# Patient Record
Sex: Female | Born: 1980 | Race: White | Hispanic: No | Smoking: Never smoker
Health system: Southern US, Community
[De-identification: ages and names within clinical notes are randomized; demographics above are authoritative.]

## PROBLEM LIST (undated history)

## (undated) DIAGNOSIS — O139 Gestational [pregnancy-induced] hypertension without significant proteinuria, unspecified trimester: Secondary | ICD-10-CM

## (undated) DIAGNOSIS — O24419 Gestational diabetes mellitus in pregnancy, unspecified control: Secondary | ICD-10-CM

## (undated) HISTORY — DX: Gestational (pregnancy-induced) hypertension without significant proteinuria, unspecified trimester: O13.9

## (undated) HISTORY — PX: TUBAL LIGATION: SHX77

## (undated) HISTORY — DX: Gestational diabetes mellitus in pregnancy, unspecified control: O24.419

---

## 2000-06-27 HISTORY — PX: WISDOM TOOTH EXTRACTION: SHX21

## 2020-01-29 ENCOUNTER — Telehealth: Payer: Self-pay

## 2020-01-29 NOTE — Telephone Encounter (Signed)
Tried calling pt to pre-chart for her new patient appt Monday and she did not answer. Told her to call back and we can do pre chart when she does.  CM

## 2020-01-30 ENCOUNTER — Telehealth: Payer: Self-pay

## 2020-01-30 NOTE — Telephone Encounter (Signed)
Called pt to pre chart

## 2020-02-03 ENCOUNTER — Encounter: Payer: Self-pay | Admitting: Internal Medicine

## 2020-02-03 ENCOUNTER — Ambulatory Visit (INDEPENDENT_AMBULATORY_CARE_PROVIDER_SITE_OTHER): Payer: Self-pay | Admitting: Internal Medicine

## 2020-02-03 ENCOUNTER — Other Ambulatory Visit: Payer: Self-pay

## 2020-02-03 VITALS — BP 120/76 | HR 89 | Temp 98.5°F | Ht 66.0 in | Wt 160.0 lb

## 2020-02-03 DIAGNOSIS — Z7689 Persons encountering health services in other specified circumstances: Secondary | ICD-10-CM

## 2020-02-03 DIAGNOSIS — Z1231 Encounter for screening mammogram for malignant neoplasm of breast: Secondary | ICD-10-CM

## 2020-02-03 NOTE — Progress Notes (Signed)
Date:  02/03/2020   Name:  Lauren Gilmore   DOB:  08-15-1980   MRN:  [id number]   Chief Complaint: Establish Care  Pap ~2016 normal Mammogram - none (maternal grandmother had breast cancer)  Depression        This is a recurrent problem.  The problem occurs intermittently.  The problem has been waxing and waning since onset.  Associated symptoms include headaches (muscle tension headaches).  Associated symptoms include no fatigue, no appetite change and no suicidal ideas.     The symptoms are aggravated by family issues. She has just started sharing custody of her 82 y/o daughter with the father who lives 1.5 hours away - they alternate weeks and she is having trouble adjusting.  She keeps busy with her 34 y/o son and significant other in the home.  No results found for: CREATININE, BUN, NA, K, CL, CO2 No results found for: CHOL, HDL, LDLCALC, LDLDIRECT, TRIG, CHOLHDL No results found for: TSH No results found for: HGBA1C No results found for: WBC, HGB, HCT, MCV, PLT No results found for: ALT, AST, GGT, ALKPHOS, BILITOT   Review of Systems  Constitutional: Negative for appetite change, chills, fatigue and unexpected weight change.  Respiratory: Negative for chest tightness and shortness of breath.   Cardiovascular: Negative for chest pain, palpitations and leg swelling.  Genitourinary: Negative for menstrual problem.  Neurological: Positive for headaches (muscle tension headaches). Negative for dizziness.  Psychiatric/Behavioral: Positive for depression and dysphoric mood. Negative for sleep disturbance and suicidal ideas. The patient is nervous/anxious.     There are no problems to display for this patient.   Allergies  Allergen Reactions   Amoxicillin Hives and Rash    Past Surgical History:  Procedure Laterality Date   CESAREAN SECTION  2017   TUBAL LIGATION     WISDOM TOOTH EXTRACTION  2002    Social History   Tobacco Use   Smoking status: Never Smoker    Smokeless tobacco: Never Used  Vaping Use   Vaping Use: Never used  Substance Use Topics   Alcohol use: Yes    Comment: 1-2 week   Drug use: Not Currently     Medication list has been reviewed and updated.  No outpatient medications have been marked as taking for the 02/03/20 encounter (Office Visit) with Reubin Milan, MD.    [facility] 2/9 Scores 02/03/2020  PHQ - 2 Score 1  PHQ- 9 Score 9    GAD 7 : Generalized Anxiety Score 02/03/2020  Nervous, Anxious, on Edge 3  Control/stop worrying 3  Worry too much - different things 3  Trouble relaxing 2  Restless 2  Easily annoyed or irritable 3  Afraid - awful might happen 2  Total GAD 7 Score 18  Anxiety Difficulty Somewhat difficult    BP Readings from Last 3 Encounters:  02/03/20 120/76    Physical Exam Vitals and nursing note reviewed.  Constitutional:      General: She is not in acute distress.    Appearance: Normal appearance. She is well-developed.  HENT:     Head: Normocephalic and atraumatic.  Cardiovascular:     Rate and Rhythm: Normal rate and regular rhythm.     Pulses: Normal pulses.     Heart sounds: No murmur heard.   Pulmonary:     Effort: Pulmonary effort is normal. No respiratory distress.     Breath sounds: No wheezing or rhonchi.  Musculoskeletal:  General: Normal range of motion.     Cervical back: Normal range of motion.  Lymphadenopathy:     Cervical: No cervical adenopathy.  Skin:    General: Skin is warm and dry.     Findings: Rash (poison ivy on right hand) present.  Neurological:     General: No focal deficit present.     Mental Status: She is alert and oriented to person, place, and time.     Motor: Motor function is intact.     Coordination: Coordination is intact.     Deep Tendon Reflexes:     Reflex Scores:      Bicep reflexes are 1+ on the right side and 1+ on the left side.      Patellar reflexes are 1+ on the right side and 1+ on the left side. Psychiatric:         Attention and Perception: Attention normal.        Mood and Affect: Mood normal.        Speech: Speech normal.        Behavior: Behavior normal.        Thought Content: Thought content does not include suicidal ideation. Thought content does not include suicidal plan.     Wt Readings from Last 3 Encounters:  02/03/20 160 lb (72.6 kg)    BP 120/76    Pulse 89    Temp 98.5 F (36.9 C) (Oral)    Ht 5\' 6"  (1.676 m)    Wt 160 lb (72.6 kg)    LMP 01/09/2020 (Approximate)    SpO2 96%    BMI 25.82 kg/m   Assessment and Plan: 1. Encounter to establish care Pt will return for CPX and Pap Mood disorder is moderately severe but she is coping well and does not require medication or counseling at this time  2. Encounter for screening mammogram for breast cancer Call for mammogram for the uninsured - MM 3D SCREEN BREAST BILATERAL; Future   Partially dictated using 01/11/2020. Any errors are unintentional.  Animal nutritionist, MD [facility]  02/03/2020

## 2020-02-27 ENCOUNTER — Other Ambulatory Visit: Payer: Self-pay

## 2020-02-27 ENCOUNTER — Ambulatory Visit
Admission: RE | Admit: 2020-02-27 | Discharge: 2020-02-27 | Disposition: A | Discharge status: Home / Self Care | Payer: 59 | Source: Ambulatory Visit | Attending: Internal Medicine | Admitting: Internal Medicine

## 2020-02-27 DIAGNOSIS — Z1231 Encounter for screening mammogram for malignant neoplasm of breast: Secondary | ICD-10-CM | POA: Diagnosis present

## 2020-03-24 ENCOUNTER — Other Ambulatory Visit (HOSPITAL_COMMUNITY)
Admission: RE | Admit: 2020-03-24 | Discharge: 2020-03-24 | Disposition: A | Discharge status: Home / Self Care | Payer: 59 | Source: Ambulatory Visit | Attending: Internal Medicine | Admitting: Internal Medicine

## 2020-03-24 ENCOUNTER — Ambulatory Visit (INDEPENDENT_AMBULATORY_CARE_PROVIDER_SITE_OTHER): Payer: 59 | Admitting: Internal Medicine

## 2020-03-24 ENCOUNTER — Encounter: Payer: Self-pay | Admitting: Internal Medicine

## 2020-03-24 ENCOUNTER — Other Ambulatory Visit: Payer: Self-pay

## 2020-03-24 VITALS — BP 124/88 | HR 79 | Temp 98.3°F | Ht 66.0 in | Wt 159.0 lb

## 2020-03-24 DIAGNOSIS — Z124 Encounter for screening for malignant neoplasm of cervix: Secondary | ICD-10-CM | POA: Diagnosis present

## 2020-03-24 DIAGNOSIS — F39 Unspecified mood [affective] disorder: Secondary | ICD-10-CM | POA: Diagnosis not present

## 2020-03-24 DIAGNOSIS — Z0000 Encounter for general adult medical examination without abnormal findings: Secondary | ICD-10-CM | POA: Diagnosis not present

## 2020-03-24 DIAGNOSIS — Z23 Encounter for immunization: Secondary | ICD-10-CM

## 2020-03-24 LAB — POCT URINALYSIS DIPSTICK
Bilirubin, UA: NEGATIVE
Blood, UA: NEGATIVE
Glucose, UA: NEGATIVE
Ketones, UA: NEGATIVE
Leukocytes, UA: NEGATIVE
Nitrite, UA: NEGATIVE
Protein, UA: NEGATIVE
Spec Grav, UA: <=1.005 — AB (ref 1.010–1.025)
Urobilinogen, UA: 0.2 E.U./dL
pH, UA: 6 (ref 5.0–8.0)

## 2020-03-24 MED ORDER — ESCITALOPRAM OXALATE 10 MG PO TABS: 10.0000 mg | ORAL_TABLET | Freq: Every day | ORAL | 1 refills | Status: DC

## 2020-03-24 NOTE — Progress Notes (Signed)
Date:  03/24/2020   Name:  Lauren Gilmore   DOB:  December 17, 1980   MRN:  [id number]   Chief Complaint: Annual Exam (Breast Exam. Pap smear. Flu shot. ), Anxiety (19 GAD7), and Depression (18 PHQ9)  Lauren Gilmore is a 39 y.o. female who presents today for her Complete Annual Exam. She feels well. She reports exercising - walking 8 hours daily at work. She reports she is sleeping fairly well. Breast complaints - none.  Mammogram: 02/2020 Pap smear: due 2016 normal   Immunization History  Administered Date(s) Administered  . PFIZER SARS-COV-2 Vaccination 10/04/2019, 10/25/2019  . PPD Test 10/07/2019    Depression        This is a new problem.  The current episode started more than 1 month ago. The problem is unchanged.  Associated symptoms include insomnia, irritable (and anxious) and appetite change.  Associated symptoms include no fatigue, no headaches and no suicidal ideas.     The symptoms are aggravated by family issues.  Past treatments include nothing.   No results found for: CREATININE, BUN, NA, K, CL, CO2 No results found for: CHOL, HDL, LDLCALC, LDLDIRECT, TRIG, CHOLHDL No results found for: TSH No results found for: HGBA1C No results found for: WBC, HGB, HCT, MCV, PLT No results found for: ALT, AST, GGT, ALKPHOS, BILITOT   Review of Systems  Constitutional: Positive for appetite change. Negative for chills, fatigue and fever.  HENT: Negative for congestion, hearing loss, tinnitus, trouble swallowing and voice change.   Eyes: Negative for visual disturbance.  Respiratory: Negative for cough, chest tightness, shortness of breath and wheezing.   Cardiovascular: Positive for palpitations. Negative for chest pain and leg swelling.  Gastrointestinal: Negative for abdominal pain, constipation, diarrhea and vomiting.  Endocrine: Negative for polydipsia and polyuria.  Genitourinary: Negative for dysuria, frequency, genital sores, menstrual problem, vaginal bleeding  and vaginal discharge.  Musculoskeletal: Negative for arthralgias, gait problem and joint swelling.  Skin: Negative for color change and rash.  Neurological: Negative for dizziness, tremors, light-headedness and headaches.  Hematological: Negative for adenopathy. Does not bruise/bleed easily.  Psychiatric/Behavioral: Positive for depression and sleep disturbance (Difficulty falling asleep.). Negative for dysphoric mood and suicidal ideas. The patient is nervous/anxious (Going through a divorce and child custody battle.) and has insomnia.     There are no problems to display for this patient.   Allergies  Allergen Reactions  . Amoxicillin Hives and Rash    Past Surgical History:  Procedure Laterality Date  . CESAREAN SECTION  2017  . TUBAL LIGATION    . WISDOM TOOTH EXTRACTION  2002    Social History   Tobacco Use  . Smoking status: Never Smoker  . Smokeless tobacco: Never Used  Vaping Use  . Vaping Use: Never used  Substance Use Topics  . Alcohol use: Yes    Comment: 1-2 week  . Drug use: Not Currently     Medication list has been reviewed and updated.  No outpatient medications have been marked as taking for the 03/24/20 encounter (Office Visit) with Reubin Milan, MD.    [facility] 2/9 Scores 03/24/2020 02/03/2020  PHQ - 2 Score 4 1  PHQ- 9 Score 18 9    GAD 7 : Generalized Anxiety Score 03/24/2020 02/03/2020  Nervous, Anxious, on Edge 3 3  Control/stop worrying 3 3  Worry too much - different things 3 3  Trouble relaxing 3 2  Restless 3 2  Easily annoyed or irritable 3 3  Afraid -  awful might happen 1 2  Total GAD 7 Score 19 18  Anxiety Difficulty Extremely difficult Somewhat difficult    BP Readings from Last 3 Encounters:  03/24/20 124/88  02/03/20 120/76    Physical Exam Vitals and nursing note reviewed.  Constitutional:      General: She is irritable (and anxious). She is not in acute distress.    Appearance: She is well-developed.  HENT:     Head:  Normocephalic and atraumatic.     Right Ear: Tympanic membrane and ear canal normal.     Left Ear: Tympanic membrane and ear canal normal.     Nose:     Right Sinus: No maxillary sinus tenderness.     Left Sinus: No maxillary sinus tenderness.  Eyes:     General: No scleral icterus.       Right eye: No discharge.        Left eye: No discharge.     Conjunctiva/sclera: Conjunctivae normal.  Neck:     Thyroid: No thyromegaly.     Vascular: No carotid bruit.  Cardiovascular:     Rate and Rhythm: Normal rate and regular rhythm.     Pulses: Normal pulses.     Heart sounds: Normal heart sounds.  Pulmonary:     Effort: Pulmonary effort is normal. No respiratory distress.     Breath sounds: No wheezing.  Chest:     Breasts:        Right: No mass, nipple discharge, skin change or tenderness.        Left: No mass, nipple discharge, skin change or tenderness.  Abdominal:     General: Bowel sounds are normal.     Palpations: Abdomen is soft.     Tenderness: There is no abdominal tenderness.  Genitourinary:    Labia:        Right: No tenderness, lesion or injury.        Left: No tenderness, lesion or injury.      Vagina: Normal.     Cervix: Normal.     Uterus: Normal.      Adnexa: Right adnexa normal and left adnexa normal.  Musculoskeletal:        General: Normal range of motion.     Cervical back: Normal range of motion. No erythema.     Right lower leg: No edema.     Left lower leg: No edema.  Lymphadenopathy:     Cervical: No cervical adenopathy.  Skin:    General: Skin is warm and dry.     Findings: No rash.  Neurological:     Mental Status: She is alert and oriented to person, place, and time.     Cranial Nerves: No cranial nerve deficit.     Sensory: No sensory deficit.     Deep Tendon Reflexes: Reflexes are normal and symmetric.  Psychiatric:        Attention and Perception: Attention normal.        Mood and Affect: Mood is depressed.        Speech: Speech normal.          Thought Content: Thought content does not include suicidal ideation. Thought content does not include suicidal plan.        Cognition and Memory: Cognition normal.        Judgment: Judgment normal.     Wt Readings from Last 3 Encounters:  03/24/20 159 lb (72.1 kg)  02/03/20 160 lb (72.6 kg)    BP 124/88  Pulse 79   Temp 98.3 F (36.8 C) (Oral)   Ht 5\' 6"  (1.676 m)   Wt 159 lb (72.1 kg)   LMP 03/01/2020 (Exact Date)   SpO2 99%   BMI 25.66 kg/m   Assessment and Plan: 1. Annual physical exam Normal exam Recommend healthy diet, regular exercise - CBC with Differential/Platelet - Comprehensive metabolic panel - Lipid panel - TSH - POCT urinalysis dipstick  2. Encounter for screening for cervical cancer Obtained today pap with HPV - Cytology - PAP  3. Mood disorder (HCC) Begin Lexapro 5 mg for one week then 10 mg daily and follow up in one month - escitalopram (LEXAPRO) 10 MG tablet; Take 1 tablet (10 mg total) by mouth daily.  Dispense: 30 tablet; Refill: 1   Partially dictated using 05/01/2020. Any errors are unintentional.  Animal nutritionist, MD [facility]  03/24/2020

## 2020-03-25 LAB — COMPREHENSIVE METABOLIC PANEL
ALT: 10 IU/L (ref 0–32)
AST: 13 IU/L (ref 0–40)
Albumin/Globulin Ratio: 1.9 (ref 1.2–2.2)
Albumin: 4.7 g/dL (ref 3.8–4.8)
Alkaline Phosphatase: 63 IU/L (ref 44–121)
BUN/Creatinine Ratio: 19 (ref 9–23)
BUN: 18 mg/dL (ref 6–24)
Bilirubin Total: 0.5 mg/dL (ref 0.0–1.2)
CO2: 22 mmol/L (ref 20–29)
Calcium: 9.3 mg/dL (ref 8.7–10.2)
Chloride: 104 mmol/L (ref 96–106)
Creatinine, Ser: 0.94 mg/dL (ref 0.57–1.00)
GFR calc Af Amer: 87 mL/min/{1.73_m2} (ref >59)
GFR calc non Af Amer: 76 mL/min/{1.73_m2} (ref >59)
Globulin, Total: 2.5 g/dL (ref 1.5–4.5)
Glucose: 89 mg/dL (ref 65–99)
Potassium: 4.2 mmol/L (ref 3.5–5.2)
Sodium: 138 mmol/L (ref 134–144)
Total Protein: 7.2 g/dL (ref 6.0–8.5)

## 2020-03-25 LAB — CBC WITH DIFFERENTIAL/PLATELET
Basophils Absolute: 0.1 10*3/uL (ref 0.0–0.2)
Basos: 1 %
EOS (ABSOLUTE): 0.1 10*3/uL (ref 0.0–0.4)
Eos: 1 %
Hematocrit: 43.1 % (ref 34.0–46.6)
Hemoglobin: 14.1 g/dL (ref 11.1–15.9)
Immature Grans (Abs): 0 10*3/uL (ref 0.0–0.1)
Immature Granulocytes: 0 %
Lymphocytes Absolute: 1.7 10*3/uL (ref 0.7–3.1)
Lymphs: 30 %
MCH: 30.7 pg (ref 26.6–33.0)
MCHC: 32.7 g/dL (ref 31.5–35.7)
MCV: 94 fL (ref 79–97)
Monocytes Absolute: 0.5 10*3/uL (ref 0.1–0.9)
Monocytes: 8 %
Neutrophils Absolute: 3.4 10*3/uL (ref 1.4–7.0)
Neutrophils: 60 %
Platelets: 196 10*3/uL (ref 150–450)
RBC: 4.59 x10E6/uL (ref 3.77–5.28)
RDW: 12.6 % (ref 11.7–15.4)
WBC: 5.7 10*3/uL (ref 3.4–10.8)

## 2020-03-25 LAB — LIPID PANEL
Chol/HDL Ratio: 2.5 ratio (ref 0.0–4.4)
Cholesterol, Total: 158 mg/dL (ref 100–199)
HDL: 62 mg/dL (ref >39)
LDL Chol Calc (NIH): 83 mg/dL (ref 0–99)
Triglycerides: 69 mg/dL (ref 0–149)
VLDL Cholesterol Cal: 13 mg/dL (ref 5–40)

## 2020-03-25 LAB — TSH: TSH: 1.4 u[IU]/mL (ref 0.450–4.500)

## 2020-03-26 LAB — CYTOLOGY - PAP
Comment: NEGATIVE
Diagnosis: NEGATIVE
High risk HPV: NEGATIVE

## 2020-04-23 ENCOUNTER — Telehealth: Payer: Self-pay | Admitting: Internal Medicine

## 2020-04-23 NOTE — Telephone Encounter (Signed)
Patient is  calling to reschedule appt for 04/24/20 for depression medication follow up. Patient is requesting a virtual appt for 8:00a, 1:00p, or last of the day appt. No available virtual appt are available for Dr. Judithann Graves. Please advise with the patient . [phone]

## 2020-04-24 ENCOUNTER — Ambulatory Visit: Payer: 59 | Admitting: Internal Medicine

## 2020-04-24 ENCOUNTER — Ambulatory Visit: Payer: Self-pay | Admitting: Internal Medicine

## 2020-04-24 NOTE — Telephone Encounter (Signed)
Error pts appt is at 4:20 PM.  KP

## 2020-04-24 NOTE — Telephone Encounter (Signed)
Called pt told her to call back to confirm appt. For today at 3:40 PM.  Will route result note to [facility] Nurse Triage for follow up when patient returns call to clinic. Nurse may give results to patient if they return call. CRM created for this message.   KP

## 2020-10-02 ENCOUNTER — Other Ambulatory Visit: Payer: Self-pay | Admitting: Internal Medicine

## 2020-10-02 DIAGNOSIS — Z1231 Encounter for screening mammogram for malignant neoplasm of breast: Secondary | ICD-10-CM

## 2021-03-25 ENCOUNTER — Encounter: Payer: Self-pay | Admitting: Internal Medicine

## 2021-03-25 ENCOUNTER — Other Ambulatory Visit: Payer: Self-pay

## 2021-03-25 ENCOUNTER — Ambulatory Visit
Admission: RE | Admit: 2021-03-25 | Discharge: 2021-03-25 | Disposition: A | Discharge status: Home / Self Care | Payer: 59 | Source: Ambulatory Visit | Attending: Internal Medicine | Admitting: Internal Medicine

## 2021-03-25 ENCOUNTER — Ambulatory Visit (INDEPENDENT_AMBULATORY_CARE_PROVIDER_SITE_OTHER): Payer: 59 | Admitting: Internal Medicine

## 2021-03-25 VITALS — BP 128/70 | HR 72 | Ht 66.0 in | Wt 171.0 lb

## 2021-03-25 DIAGNOSIS — Z23 Encounter for immunization: Secondary | ICD-10-CM | POA: Diagnosis not present

## 2021-03-25 DIAGNOSIS — Z1231 Encounter for screening mammogram for malignant neoplasm of breast: Secondary | ICD-10-CM | POA: Insufficient documentation

## 2021-03-25 DIAGNOSIS — Z0000 Encounter for general adult medical examination without abnormal findings: Secondary | ICD-10-CM | POA: Diagnosis not present

## 2021-03-25 DIAGNOSIS — F39 Unspecified mood [affective] disorder: Secondary | ICD-10-CM

## 2021-03-25 DIAGNOSIS — Z1159 Encounter for screening for other viral diseases: Secondary | ICD-10-CM | POA: Diagnosis not present

## 2021-03-25 NOTE — Progress Notes (Signed)
Date:  03/25/2021   Name:  Lauren Gilmore   DOB:  1981-01-25   MRN:  [id number]   Chief Complaint: Annual Exam (Breast Exam. Next pap due in 2025.) Lauren Gilmore is a 40 y.o. female who presents today for her Complete Annual Exam. She feels well. She reports exercising 3 x weekly. She reports she is sleeping fairly well. Has trouble falling asleep at night. Breast complaints - none.  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  Mammogram: done today DEXA: none Pap smear: 01/2020 neg with co-testing Colonoscopy: none  Immunization History  Administered Date(s) Administered   Influenza,inj,Quad PF,6+ Mos 03/24/2020   PFIZER(Purple Top)SARS-COV-2 Vaccination 10/04/2019, 10/25/2019, 07/28/2020   PPD Test 10/07/2019   Tdap 09/07/2012    Depression        This is a new (seen last year and prescribed lexapro but never followed up) problem.  The problem has been gradually improving since onset.  Associated symptoms include no fatigue and no headaches.  Past treatments include SSRIs - Selective serotonin reuptake inhibitors (lexapro interrupted sleep; now trying CBD).  Compliance with treatment is good.  Lab Results  Component Value Date   CREATININE 0.94 03/24/2020   BUN 18 03/24/2020   NA 138 03/24/2020   K 4.2 03/24/2020   CL 104 03/24/2020   CO2 22 03/24/2020   Lab Results  Component Value Date   CHOL 158 03/24/2020   HDL 62 03/24/2020   LDLCALC 83 03/24/2020   TRIG 69 03/24/2020   CHOLHDL 2.5 03/24/2020   Lab Results  Component Value Date   TSH 1.400 03/24/2020   No results found for: HGBA1C Lab Results  Component Value Date   WBC 5.7 03/24/2020   HGB 14.1 03/24/2020   HCT 43.1 03/24/2020   MCV 94 03/24/2020   PLT 196 03/24/2020   Lab Results  Component Value Date   ALT 10 03/24/2020   AST 13 03/24/2020   ALKPHOS 63 03/24/2020   BILITOT 0.5 03/24/2020     Review of Systems  Constitutional:  Negative for chills, fatigue and fever.  HENT:   Negative for congestion, hearing loss, tinnitus, trouble swallowing and voice change.   Eyes:  Negative for visual disturbance.  Respiratory:  Negative for cough, chest tightness, shortness of breath and wheezing.   Cardiovascular:  Negative for chest pain, palpitations and leg swelling.  Gastrointestinal:  Negative for abdominal pain, constipation, diarrhea and vomiting.  Endocrine: Negative for polydipsia and polyuria.  Genitourinary:  Negative for dysuria, frequency, genital sores, vaginal bleeding and vaginal discharge.  Musculoskeletal:  Positive for arthralgias (hip discomfort with prolonged sitting). Negative for gait problem and joint swelling.  Skin:  Negative for color change and rash.  Neurological:  Negative for dizziness, tremors, light-headedness and headaches.  Hematological:  Negative for adenopathy. Does not bruise/bleed easily.  Psychiatric/Behavioral:  Positive for depression and sleep disturbance. Negative for dysphoric mood. The patient is not nervous/anxious.    Patient Active Problem List   Diagnosis Date Noted   Mood disorder (HCC) 03/24/2020    Allergies  Allergen Reactions   Amoxicillin Hives and Rash    Past Surgical History:  Procedure Laterality Date   CESAREAN SECTION  2017   TUBAL LIGATION     WISDOM TOOTH EXTRACTION  2002    Social History   Tobacco Use   Smoking status: Never   Smokeless tobacco: Never  Vaping Use   Vaping Use: Never used  Substance Use Topics   Alcohol use: Yes  Comment: 1-2 week   Drug use: Not Currently     Medication list has been reviewed and updated.  Current Meds  Medication Sig   Lido-Menthol-Methyl Sal-Camph (CBD KINGS EX) Apply topically. 50 mg- anxiety   Melatonin 10 MG CAPS Take by mouth.    PHQ 2/9 Scores 03/25/2021 03/24/2020 02/03/2020  PHQ - 2 Score 2 4 1   PHQ- 9 Score 8 18 9     GAD 7 : Generalized Anxiety Score 03/25/2021 03/24/2020 02/03/2020  Nervous, Anxious, on Edge 1 3 3   Control/stop  worrying 1 3 3   Worry too much - different things 1 3 3   Trouble relaxing 1 3 2   Restless 1 3 2   Easily annoyed or irritable 1 3 3   Afraid - awful might happen 1 1 2   Total GAD 7 Score 7 19 18   Anxiety Difficulty Somewhat difficult Extremely difficult Somewhat difficult    BP Readings from Last 3 Encounters:  03/25/21 128/70  03/24/20 124/88  02/03/20 120/76    Physical Exam Vitals and nursing note reviewed.  Constitutional:      General: She is not in acute distress.    Appearance: She is well-developed.  HENT:     Head: Normocephalic and atraumatic.     Right Ear: Tympanic membrane and ear canal normal.     Left Ear: Tympanic membrane and ear canal normal.     Nose:     Right Sinus: No maxillary sinus tenderness.     Left Sinus: No maxillary sinus tenderness.  Eyes:     General: No scleral icterus.       Right eye: No discharge.        Left eye: No discharge.     Conjunctiva/sclera: Conjunctivae normal.  Neck:     Thyroid: No thyromegaly.     Vascular: No carotid bruit.  Cardiovascular:     Rate and Rhythm: Normal rate and regular rhythm.     Pulses: Normal pulses.     Heart sounds: Normal heart sounds.  Pulmonary:     Effort: Pulmonary effort is normal. No respiratory distress.     Breath sounds: No wheezing.  Abdominal:     General: Bowel sounds are normal.     Palpations: Abdomen is soft.     Tenderness: There is no abdominal tenderness.  Musculoskeletal:     Cervical back: Normal range of motion. No erythema.     Right hip: Tenderness (along the outer right hip) present.     Left hip: Normal.     Right lower leg: No edema.     Left lower leg: No edema.  Lymphadenopathy:     Cervical: No cervical adenopathy.  Skin:    General: Skin is warm and dry.     Capillary Refill: Capillary refill takes less than 2 seconds.     Findings: No rash.  Neurological:     General: No focal deficit present.     Mental Status: She is alert and oriented to person, place,  and time.     Cranial Nerves: No cranial nerve deficit.     Sensory: No sensory deficit.     Deep Tendon Reflexes: Reflexes are normal and symmetric.  Psychiatric:        Attention and Perception: Attention normal.        Mood and Affect: Mood normal.        Behavior: Behavior normal.    Wt Readings from Last 3 Encounters:  03/25/21 171 lb (77.6 kg)  03/24/20 159 lb (72.1 kg)  02/03/20 160 lb (72.6 kg)    BP 128/70   Pulse 72   Ht 5\' 6"  (1.676 m)   Wt 171 lb (77.6 kg)   LMP 03/21/2021   SpO2 97%   BMI 27.60 kg/m   Assessment and Plan: 1. Annual physical exam Normal exam Continue exercise and healthy diet Recommend ice and tylenol as needed for right hip bursitis - CBC with Differential/Platelet - Comprehensive metabolic panel - Lipid panel  2. Encounter for screening mammogram for breast cancer Done earlier today  3. Mood disorder (HCC) Improved.  Expecting resolution to the custody battle in the next few months. Continue CBD and Melatonin as needed - TSH  4. Need for hepatitis C screening test - Hepatitis C antibody   Partially dictated using 03/23/2021. Any errors are unintentional.  Animal nutritionist, MD [facility]  03/25/2021

## 2021-03-26 ENCOUNTER — Encounter: Payer: 59 | Admitting: Internal Medicine

## 2021-03-26 LAB — CBC WITH DIFFERENTIAL/PLATELET
Basophils Absolute: 0 10*3/uL (ref 0.0–0.2)
Basos: 1 %
EOS (ABSOLUTE): 0.1 10*3/uL (ref 0.0–0.4)
Eos: 2 %
Hematocrit: 43.7 % (ref 34.0–46.6)
Hemoglobin: 14.5 g/dL (ref 11.1–15.9)
Immature Grans (Abs): 0 10*3/uL (ref 0.0–0.1)
Immature Granulocytes: 0 %
Lymphocytes Absolute: 1.5 10*3/uL (ref 0.7–3.1)
Lymphs: 28 %
MCH: 31 pg (ref 26.6–33.0)
MCHC: 33.2 g/dL (ref 31.5–35.7)
MCV: 94 fL (ref 79–97)
Monocytes Absolute: 0.4 10*3/uL (ref 0.1–0.9)
Monocytes: 8 %
Neutrophils Absolute: 3.1 10*3/uL (ref 1.4–7.0)
Neutrophils: 61 %
Platelets: 202 10*3/uL (ref 150–450)
RBC: 4.67 x10E6/uL (ref 3.77–5.28)
RDW: 12.5 % (ref 11.7–15.4)
WBC: 5.1 10*3/uL (ref 3.4–10.8)

## 2021-03-26 LAB — COMPREHENSIVE METABOLIC PANEL
ALT: 8 IU/L (ref 0–32)
AST: 14 IU/L (ref 0–40)
Albumin/Globulin Ratio: 2 (ref 1.2–2.2)
Albumin: 4.7 g/dL (ref 3.8–4.8)
Alkaline Phosphatase: 75 IU/L (ref 44–121)
BUN/Creatinine Ratio: 16 (ref 9–23)
BUN: 15 mg/dL (ref 6–24)
Bilirubin Total: 0.4 mg/dL (ref 0.0–1.2)
CO2: 23 mmol/L (ref 20–29)
Calcium: 9.7 mg/dL (ref 8.7–10.2)
Chloride: 103 mmol/L (ref 96–106)
Creatinine, Ser: 0.95 mg/dL (ref 0.57–1.00)
Globulin, Total: 2.4 g/dL (ref 1.5–4.5)
Glucose: 94 mg/dL (ref 70–99)
Potassium: 4.9 mmol/L (ref 3.5–5.2)
Sodium: 140 mmol/L (ref 134–144)
Total Protein: 7.1 g/dL (ref 6.0–8.5)
eGFR: 77 mL/min/{1.73_m2} (ref >59)

## 2021-03-26 LAB — LIPID PANEL
Chol/HDL Ratio: 2.8 ratio (ref 0.0–4.4)
Cholesterol, Total: 146 mg/dL (ref 100–199)
HDL: 52 mg/dL (ref >39)
LDL Chol Calc (NIH): 75 mg/dL (ref 0–99)
Triglycerides: 101 mg/dL (ref 0–149)
VLDL Cholesterol Cal: 19 mg/dL (ref 5–40)

## 2021-03-26 LAB — HEPATITIS C ANTIBODY: Hep C Virus Ab: <0.1 s/co ratio (ref 0.0–0.9)

## 2021-03-26 LAB — TSH: TSH: 1.79 u[IU]/mL (ref 0.450–4.500)

## 2022-02-22 ENCOUNTER — Other Ambulatory Visit: Payer: Self-pay | Admitting: Internal Medicine

## 2022-02-22 DIAGNOSIS — Z1231 Encounter for screening mammogram for malignant neoplasm of breast: Secondary | ICD-10-CM

## 2022-03-04 ENCOUNTER — Telehealth: Payer: 59 | Admitting: Physician Assistant

## 2022-03-04 DIAGNOSIS — R3989 Other symptoms and signs involving the genitourinary system: Secondary | ICD-10-CM

## 2022-03-04 MED ORDER — SULFAMETHOXAZOLE-TRIMETHOPRIM 800-160 MG PO TABS: 1.0000 | ORAL_TABLET | Freq: Two times a day (BID) | ORAL | 0 refills | Status: DC

## 2022-03-04 NOTE — Patient Instructions (Signed)
[name], thank you for joining Margaretann Loveless, PA-C for today's virtual visit.  While this provider is not your primary care provider (PCP), if your PCP is located in our provider database this encounter information will be shared with them immediately following your visit.  Consent: (Patient) Lauren Gilmore provided verbal consent for this virtual visit at the beginning of the encounter.  Current Medications:  Current Outpatient Medications:    sulfamethoxazole-trimethoprim (BACTRIM DS) 800-160 MG tablet, Take 1 tablet by mouth 2 (two) times daily., Disp: 10 tablet, Rfl: 0   Lido-Menthol-Methyl Sal-Camph (CBD KINGS EX), Apply topically. 50 mg- anxiety, Disp: , Rfl:    Melatonin 10 MG CAPS, Take by mouth., Disp: , Rfl:    Medications ordered in this encounter:  Meds ordered this encounter  Medications   sulfamethoxazole-trimethoprim (BACTRIM DS) 800-160 MG tablet    Sig: Take 1 tablet by mouth 2 (two) times daily.    Dispense:  10 tablet    Refill:  0    Order Specific Question:   Supervising Provider    Answer:   Hyacinth Meeker, BRIAN [[id number]]     *If you need refills on other medications prior to your next appointment, please contact your pharmacy*  Follow-Up: Call back or seek an in-person evaluation if the symptoms worsen or if the condition fails to improve as anticipated.  Other Instructions Urinary Tract Infection, Adult  A urinary tract infection (UTI) is an infection of any part of the urinary tract. The urinary tract includes the kidneys, ureters, bladder, and urethra. These organs make, store, and get rid of urine in the body. An upper UTI affects the ureters and kidneys. A lower UTI affects the bladder and urethra. What are the causes? Most urinary tract infections are caused by bacteria in your genital area around your urethra, where urine leaves your body. These bacteria grow and cause inflammation of your urinary tract. What increases the risk? You  are more likely to develop this condition if: You have a urinary catheter that stays in place. You are not able to control when you urinate or have a bowel movement (incontinence). You are female and you: Use a spermicide or diaphragm for birth control. Have low estrogen levels. Are pregnant. You have certain genes that increase your risk. You are sexually active. You take antibiotic medicines. You have a condition that causes your flow of urine to slow down, such as: An enlarged prostate, if you are female. Blockage in your urethra. A kidney stone. A nerve condition that affects your bladder control (neurogenic bladder). Not getting enough to drink, or not urinating often. You have certain medical conditions, such as: Diabetes. A weak disease-fighting system (immunesystem). Sickle cell disease. Gout. Spinal cord injury. What are the signs or symptoms? Symptoms of this condition include: Needing to urinate right away (urgency). Frequent urination. This may include small amounts of urine each time you urinate. Pain or burning with urination. Blood in the urine. Urine that smells bad or unusual. Trouble urinating. Cloudy urine. Vaginal discharge, if you are female. Pain in the abdomen or the lower back. You may also have: Vomiting or a decreased appetite. Confusion. Irritability or tiredness. A fever or chills. Diarrhea. The first symptom in older adults may be confusion. In some cases, they may not have any symptoms until the infection has worsened. How is this diagnosed? This condition is diagnosed based on your medical history and a physical exam. You may also have other tests, including: Urine tests. Blood tests.  Tests for STIs (sexually transmitted infections). If you have had more than one UTI, a cystoscopy or imaging studies may be done to determine the cause of the infections. How is this treated? Treatment for this condition includes: Antibiotic  medicine. Over-the-counter medicines to treat discomfort. Drinking enough water to stay hydrated. If you have frequent infections or have other conditions such as a kidney stone, you may need to see a health care provider who specializes in the urinary tract (urologist). In rare cases, urinary tract infections can cause sepsis. Sepsis is a life-threatening condition that occurs when the body responds to an infection. Sepsis is treated in the hospital with IV antibiotics, fluids, and other medicines. Follow these instructions at home:  Medicines Take over-the-counter and prescription medicines only as told by your health care provider. If you were prescribed an antibiotic medicine, take it as told by your health care provider. Do not stop using the antibiotic even if you start to feel better. General instructions Make sure you: Empty your bladder often and completely. Do not hold urine for long periods of time. Empty your bladder after sex. Wipe from front to back after urinating or having a bowel movement if you are female. Use each tissue only one time when you wipe. Drink enough fluid to keep your urine pale yellow. Keep all follow-up visits. This is important. Contact a health care provider if: Your symptoms do not get better after 1-2 days. Your symptoms go away and then return. Get help right away if: You have severe pain in your back or your lower abdomen. You have a fever or chills. You have nausea or vomiting. Summary A urinary tract infection (UTI) is an infection of any part of the urinary tract, which includes the kidneys, ureters, bladder, and urethra. Most urinary tract infections are caused by bacteria in your genital area. Treatment for this condition often includes antibiotic medicines. If you were prescribed an antibiotic medicine, take it as told by your health care provider. Do not stop using the antibiotic even if you start to feel better. Keep all follow-up visits.  This is important. This information is not intended to replace advice given to you by your health care provider. Make sure you discuss any questions you have with your health care provider. Document Revised: 01/24/2020 Document Reviewed: 01/24/2020 Elsevier Patient Education  2023 Elsevier Inc.    If you have been instructed to have an in-person evaluation today at a local Urgent Care facility, please use the link below. It will take you to a list of all of our available [facility], including address, phone number and hours of operation. Please do not delay care.  [facility]  If you or a family member do not have a primary care provider, use the link below to schedule a visit and establish care. When you choose a [facility] primary care physician or advanced practice provider, you gain a long-term partner in health. Find a Primary Care Provider  Learn more about [facility]'s in-office and virtual care options: [facility] - Get Care Now

## 2022-03-04 NOTE — Progress Notes (Signed)
Virtual Visit Consent   Lauren Gilmore, you are scheduled for a virtual visit with a [facility] provider today. Just as with appointments in the office, your consent must be obtained to participate. Your consent will be active for this visit and any virtual visit you may have with one of our providers in the next 365 days. If you have a MyChart account, a copy of this consent can be sent to you electronically.  As this is a virtual visit, video technology does not allow for your provider to perform a traditional examination. This may limit your provider's ability to fully assess your condition. If your provider identifies any concerns that need to be evaluated in person or the need to arrange testing (such as labs, EKG, etc.), we will make arrangements to do so. Although advances in technology are sophisticated, we cannot ensure that it will always work on either your end or our end. If the connection with a video visit is poor, the visit may have to be switched to a telephone visit. With either a video or telephone visit, we are not always able to ensure that we have a secure connection.  By engaging in this virtual visit, you consent to the provision of healthcare and authorize for your insurance to be billed (if applicable) for the services provided during this visit. Depending on your insurance coverage, you may receive a charge related to this service.  I need to obtain your verbal consent now. Are you willing to proceed with your visit today? [name] has provided verbal consent on 03/04/2022 for a virtual visit (video or telephone). Margaretann Loveless, PA-C  Date: 03/04/2022 10:04 AM  Virtual Visit via Video Note   I, Margaretann Loveless, connected with  Lauren Gilmore  ([id number], January 16, 1981) on 03/04/22 at 10:00 AM EDT by a video-enabled telemedicine application and verified that I am speaking with the correct person using two identifiers.  Location: Patient: Virtual  Visit Location Patient: Home Provider: Virtual Visit Location Provider: Home Office   I discussed the limitations of evaluation and management by telemedicine and the availability of in person appointments. The patient expressed understanding and agreed to proceed.    History of Present Illness: Lauren Gilmore is a 41 y.o. who identifies as a female who was assigned female at birth, and is being seen today for possible UTI.  HPI: Urinary Tract Infection  This is a new problem. The current episode started in the past 7 days (Tuesday). The problem occurs every urination. The problem has been gradually worsening. The patient is experiencing no pain. There has been no fever. Associated symptoms include frequency, hesitancy and urgency. Pertinent negatives include no chills, discharge, flank pain, hematuria, nausea or sweats. Associated symptoms comments: Cloudy urine initially. Treatments tried: AZO. The treatment provided mild relief.      Problems:  Patient Active Problem List   Diagnosis Date Noted   Mood disorder (HCC) 03/24/2020    Allergies:  Allergies  Allergen Reactions   Amoxicillin Hives and Rash   Medications:  Current Outpatient Medications:    sulfamethoxazole-trimethoprim (BACTRIM DS) 800-160 MG tablet, Take 1 tablet by mouth 2 (two) times daily., Disp: 10 tablet, Rfl: 0   Lido-Menthol-Methyl Sal-Camph (CBD KINGS EX), Apply topically. 50 mg- anxiety, Disp: , Rfl:    Melatonin 10 MG CAPS, Take by mouth., Disp: , Rfl:   Observations/Objective: Patient is well-developed, well-nourished in no acute distress.  Resting comfortably at home.  Head is normocephalic, atraumatic.  No labored  breathing.  Speech is clear and coherent with logical content.  Patient is alert and oriented at baseline.    Assessment and Plan: 1. Suspected UTI - sulfamethoxazole-trimethoprim (BACTRIM DS) 800-160 MG tablet; Take 1 tablet by mouth 2 (two) times daily.  Dispense: 10 tablet; Refill:  0  - Worsening symptoms.  - Will treat empirically with Bactrim - May use AZO for bladder spasms - Continue to push fluids.  - Seek in person evaluation for urine culture if symptoms do not improve or if they worsen.    Follow Up Instructions: I discussed the assessment and treatment plan with the patient. The patient was provided an opportunity to ask questions and all were answered. The patient agreed with the plan and demonstrated an understanding of the instructions.  A copy of instructions were sent to the patient via MyChart unless otherwise noted below.    The patient was advised to call back or seek an in-person evaluation if the symptoms worsen or if the condition fails to improve as anticipated.  Time:  I spent 8 minutes with the patient via telehealth technology discussing the above problems/concerns.    Margaretann Loveless, PA-C

## 2022-03-28 ENCOUNTER — Ambulatory Visit (INDEPENDENT_AMBULATORY_CARE_PROVIDER_SITE_OTHER): Payer: 59 | Admitting: Internal Medicine

## 2022-03-28 ENCOUNTER — Ambulatory Visit
Admission: RE | Admit: 2022-03-28 | Discharge: 2022-03-28 | Disposition: A | Discharge status: Home / Self Care | Payer: 59 | Source: Ambulatory Visit | Attending: Internal Medicine | Admitting: Internal Medicine

## 2022-03-28 ENCOUNTER — Encounter: Payer: Self-pay | Admitting: Internal Medicine

## 2022-03-28 VITALS — BP 128/72 | HR 90 | Ht 66.0 in | Wt 193.0 lb

## 2022-03-28 DIAGNOSIS — M25542 Pain in joints of left hand: Secondary | ICD-10-CM

## 2022-03-28 DIAGNOSIS — Z131 Encounter for screening for diabetes mellitus: Secondary | ICD-10-CM

## 2022-03-28 DIAGNOSIS — Z1231 Encounter for screening mammogram for malignant neoplasm of breast: Secondary | ICD-10-CM | POA: Insufficient documentation

## 2022-03-28 DIAGNOSIS — R635 Abnormal weight gain: Secondary | ICD-10-CM

## 2022-03-28 DIAGNOSIS — Z23 Encounter for immunization: Secondary | ICD-10-CM

## 2022-03-28 DIAGNOSIS — Z114 Encounter for screening for human immunodeficiency virus [HIV]: Secondary | ICD-10-CM

## 2022-03-28 DIAGNOSIS — Z13220 Encounter for screening for lipoid disorders: Secondary | ICD-10-CM | POA: Diagnosis not present

## 2022-03-28 DIAGNOSIS — M25541 Pain in joints of right hand: Secondary | ICD-10-CM

## 2022-03-28 DIAGNOSIS — F39 Unspecified mood [affective] disorder: Secondary | ICD-10-CM

## 2022-03-28 DIAGNOSIS — Z0000 Encounter for general adult medical examination without abnormal findings: Secondary | ICD-10-CM

## 2022-03-28 NOTE — Progress Notes (Signed)
Date:  03/28/2022   Name:  Lauren Gilmore   DOB:  [birth year]   MRN:  [id number]   Chief Complaint: Annual Exam Lauren Gilmore is a 41 y.o. female who presents today for her Complete Annual Exam. She feels well. She reports exercising walking daily, yard work. She reports she is sleeping well. Breast complaints - none.  Mammogram: scheduled today DEXA: none Pap smear: 02/2020 neg/neg Colonoscopy: none  Health Maintenance Due  Topic Date Due   INFLUENZA VACCINE  01/25/2022    Immunization History  Administered Date(s) Administered   Influenza,inj,Quad PF,6+ Mos 03/24/2020, 03/25/2021   PFIZER(Purple Top)SARS-COV-2 Vaccination 10/04/2019, 10/25/2019, 07/28/2020   PPD Test 10/07/2019   Tdap 09/07/2012    HPI  Lab Results  Component Value Date   NA 140 03/25/2021   K 4.9 03/25/2021   CO2 23 03/25/2021   GLUCOSE 94 03/25/2021   BUN 15 03/25/2021   CREATININE 0.95 03/25/2021   CALCIUM 9.7 03/25/2021   EGFR 77 03/25/2021   GFRNONAA 76 03/24/2020   Lab Results  Component Value Date   CHOL 146 03/25/2021   HDL 52 03/25/2021   LDLCALC 75 03/25/2021   TRIG 101 03/25/2021   CHOLHDL 2.8 03/25/2021   Lab Results  Component Value Date   TSH 1.790 03/25/2021   No results found for: "HGBA1C" Lab Results  Component Value Date   WBC 5.1 03/25/2021   HGB 14.5 03/25/2021   HCT 43.7 03/25/2021   MCV 94 03/25/2021   PLT 202 03/25/2021   Lab Results  Component Value Date   ALT 8 03/25/2021   AST 14 03/25/2021   ALKPHOS 75 03/25/2021   BILITOT 0.4 03/25/2021   No results found for: "25OHVITD2", "25OHVITD3", "VD25OH"   Review of Systems  Constitutional:  Negative for chills, fatigue and fever.  HENT:  Negative for congestion, hearing loss, tinnitus, trouble swallowing and voice change.   Eyes:  Negative for visual disturbance.  Respiratory:  Negative for cough, chest tightness, shortness of breath and wheezing.   Cardiovascular:  Negative for chest pain,  palpitations and leg swelling.  Gastrointestinal:  Negative for abdominal pain, constipation, diarrhea and vomiting.  Endocrine: Negative for polydipsia and polyuria.  Genitourinary:  Negative for dysuria, frequency, genital sores, vaginal bleeding and vaginal discharge.  Musculoskeletal:  Positive for arthralgias and joint swelling (and catching of PIP middle fingers). Negative for gait problem.  Skin:  Negative for color change and rash.  Neurological:  Negative for dizziness, tremors, light-headedness and headaches.  Hematological:  Negative for adenopathy. Does not bruise/bleed easily.  Psychiatric/Behavioral:  Negative for dysphoric mood and sleep disturbance. The patient is not nervous/anxious.     Patient Active Problem List   Diagnosis Date Noted   Mood disorder ([place]) 03/24/2020    Allergies  Allergen Reactions   Amoxicillin Hives and Rash    Past Surgical History:  Procedure Laterality Date   CESAREAN SECTION  2017   TUBAL LIGATION     WISDOM TOOTH EXTRACTION  2002    Social History   Tobacco Use   Smoking status: Never   Smokeless tobacco: Never  Vaping Use   Vaping Use: Never used  Substance Use Topics   Alcohol use: Yes    Comment: 1-2 week   Drug use: Not Currently     Medication list has been reviewed and updated.  Current Meds  Medication Sig   Lido-Menthol-Methyl Sal-Camph (CBD KINGS EX) Apply topically. 50 mg- anxiety   Melatonin 10 MG CAPS  Take by mouth.       03/28/2022    9:16 AM 03/25/2021    8:45 AM 03/24/2020    8:05 AM 02/03/2020    2:06 PM  GAD 7 : Generalized Anxiety Score  Nervous, Anxious, on Edge 0 _0 Control/stop worrying 0 _1 Worry too much - different things 0 _2 Trouble relaxing 0 _3 Restless 0 _4 Easily annoyed or irritable 0 _5 Afraid - awful might happen 0 _6 Total GAD 7 Score 0 _7 Anxiety Difficulty Not difficult at all Somewhat difficult Extremely difficult Somewhat difficult        03/28/2022    9:16 AM 03/25/2021    8:44 AM 03/24/2020    8:04 AM  Depression screen PHQ 2/9  Decreased Interest 0 1 2  Down, Depressed, Hopeless 0 1 2  PHQ - 2 Score 0 2 4  Altered sleeping 0 2 2  Tired, decreased energy 0 1 3  Change in appetite 0 2 1  Feeling bad or failure about yourself  0 1 2  Trouble concentrating 0 0 3  Moving slowly or fidgety/restless 0 0 3  Suicidal thoughts 0 0 0  PHQ-9 Score 0 8 18  Difficult doing work/chores Not difficult at all Somewhat difficult Very difficult    BP Readings from Last 3 Encounters:  03/28/22 128/72  03/25/21 128/70  03/24/20 124/88    Physical Exam Vitals and nursing note reviewed.  Constitutional:      General: She is not in acute distress.    Appearance: She is well-developed.  HENT:     Head: Normocephalic and atraumatic.     Right Ear: Tympanic membrane and ear canal normal.     Left Ear: Tympanic membrane and ear canal normal.     Nose:     Right Sinus: No maxillary sinus tenderness.     Left Sinus: No maxillary sinus tenderness.  Eyes:     General: No scleral icterus.       Right eye: No discharge.        Left eye: No discharge.     Conjunctiva/sclera: Conjunctivae normal.  Neck:     Thyroid: No thyromegaly.     Vascular: No carotid bruit.  Cardiovascular:     Rate and Rhythm: Normal rate and regular rhythm.     Pulses: Normal pulses.     Heart sounds: Normal heart sounds.  Pulmonary:     Effort: Pulmonary effort is normal. No respiratory distress.     Breath sounds: No wheezing.  Chest:  Breasts:    Right: No mass, nipple discharge, skin change or tenderness.     Left: No mass, nipple discharge, skin change or tenderness.  Abdominal:     General: Bowel sounds are normal.     Palpations: Abdomen is soft.     Tenderness: There is no abdominal tenderness.  Musculoskeletal:        General: Normal range of motion.     Right hand: Bony tenderness present.     Left hand: Bony tenderness present.      Cervical back: Normal range of motion. No erythema.     Right lower leg: No edema.     Left lower leg: No edema.     Comments: PIP middle fingers - minimal swelling without warmth/tenderness No palmar thickening  Lymphadenopathy:     Cervical:  No cervical adenopathy.  Skin:    General: Skin is warm and dry.     Capillary Refill: Capillary refill takes less than 2 seconds.     Findings: No rash.  Neurological:     General: No focal deficit present.     Mental Status: She is alert and oriented to person, place, and time.     Cranial Nerves: No cranial nerve deficit.     Sensory: No sensory deficit.     Deep Tendon Reflexes: Reflexes are normal and symmetric.  Psychiatric:        Attention and Perception: Attention normal.        Mood and Affect: Mood normal.     Wt Readings from Last 3 Encounters:  03/28/22 193 lb (87.5 kg)  03/25/21 171 lb (77.6 kg)  03/24/20 159 lb (72.1 kg)    BP 128/72   Pulse 90   Ht _0  (1.676 m)   Wt 193 lb (87.5 kg)   LMP 02/28/2022 (Exact Date)   SpO2 97%   BMI 31.15 kg/m   Assessment and Plan: 1. Annual physical exam Exam is normal except for weight. Continue regular exercise and appropriate dietary changes. - CBC with Differential/Platelet - Comprehensive metabolic panel - Hemoglobin A1c - Lipid panel - HIV Antibody (routine testing w rflx) - TSH  2. Encounter for screening mammogram for breast cancer Appointment today  3. Screening for diabetes mellitus - Hemoglobin A1c  4. Screening for lipid disorders - Lipid panel  5. Weight gain May be some fluid retention due to menstrual cycle - TSH  6. Encounter for screening for HIV - HIV Antibody (routine testing w rflx)  7. Mood disorder ([place]) resolved  8. Arthralgia of both hands Will check labs to rule out inflammatory arthritis - Comprehensive metabolic panel - Sedimentation rate - Rheumatoid factor   Partially dictated using Editor, commissioning. Any errors are  unintentional.  Halina Maidens, MD [facility]  03/28/2022

## 2022-03-29 ENCOUNTER — Other Ambulatory Visit: Payer: Self-pay | Admitting: Internal Medicine

## 2022-03-29 DIAGNOSIS — N6489 Other specified disorders of breast: Secondary | ICD-10-CM

## 2022-03-29 DIAGNOSIS — R928 Other abnormal and inconclusive findings on diagnostic imaging of breast: Secondary | ICD-10-CM

## 2022-03-29 LAB — LIPID PANEL
Chol/HDL Ratio: 3.6 ratio (ref 0.0–4.4)
Cholesterol, Total: 162 mg/dL (ref 100–199)
HDL: 45 mg/dL (ref >39)
LDL Chol Calc (NIH): 99 mg/dL (ref 0–99)
Triglycerides: 96 mg/dL (ref 0–149)
VLDL Cholesterol Cal: 18 mg/dL (ref 5–40)

## 2022-03-29 LAB — CBC WITH DIFFERENTIAL/PLATELET
Basophils Absolute: 0 10*3/uL (ref 0.0–0.2)
Basos: 1 %
EOS (ABSOLUTE): 0.1 10*3/uL (ref 0.0–0.4)
Eos: 3 %
Hematocrit: 42.3 % (ref 34.0–46.6)
Hemoglobin: 14.1 g/dL (ref 11.1–15.9)
Immature Grans (Abs): 0 10*3/uL (ref 0.0–0.1)
Immature Granulocytes: 0 %
Lymphocytes Absolute: 1.4 10*3/uL (ref 0.7–3.1)
Lymphs: 28 %
MCH: 30.2 pg (ref 26.6–33.0)
MCHC: 33.3 g/dL (ref 31.5–35.7)
MCV: 91 fL (ref 79–97)
Monocytes Absolute: 0.5 10*3/uL (ref 0.1–0.9)
Monocytes: 11 %
Neutrophils Absolute: 2.9 10*3/uL (ref 1.4–7.0)
Neutrophils: 57 %
Platelets: 193 10*3/uL (ref 150–450)
RBC: 4.67 x10E6/uL (ref 3.77–5.28)
RDW: 13.3 % (ref 11.7–15.4)
WBC: 5.1 10*3/uL (ref 3.4–10.8)

## 2022-03-29 LAB — COMPREHENSIVE METABOLIC PANEL
ALT: 14 IU/L (ref 0–32)
AST: 17 IU/L (ref 0–40)
Albumin/Globulin Ratio: 2 (ref 1.2–2.2)
Albumin: 4.8 g/dL (ref 3.9–4.9)
Alkaline Phosphatase: 83 IU/L (ref 44–121)
BUN/Creatinine Ratio: 12 (ref 9–23)
BUN: 11 mg/dL (ref 6–24)
Bilirubin Total: 0.4 mg/dL (ref 0.0–1.2)
CO2: 22 mmol/L (ref 20–29)
Calcium: 9.4 mg/dL (ref 8.7–10.2)
Chloride: 105 mmol/L (ref 96–106)
Creatinine, Ser: 0.94 mg/dL (ref 0.57–1.00)
Globulin, Total: 2.4 g/dL (ref 1.5–4.5)
Glucose: 102 mg/dL — ABNORMAL HIGH (ref 70–99)
Potassium: 4.1 mmol/L (ref 3.5–5.2)
Sodium: 140 mmol/L (ref 134–144)
Total Protein: 7.2 g/dL (ref 6.0–8.5)
eGFR: 77 mL/min/{1.73_m2} (ref >59)

## 2022-03-29 LAB — HEMOGLOBIN A1C
Est. average glucose Bld gHb Est-mCnc: 120 mg/dL
Hgb A1c MFr Bld: 5.8 % — ABNORMAL HIGH (ref 4.8–5.6)

## 2022-03-29 LAB — SEDIMENTATION RATE: Sed Rate: 5 mm/hr (ref 0–32)

## 2022-03-29 LAB — TSH: TSH: 1.53 u[IU]/mL (ref 0.450–4.500)

## 2022-03-29 LAB — RHEUMATOID FACTOR: Rheumatoid fact SerPl-aCnc: 14.8 IU/mL — ABNORMAL HIGH (ref <14.0)

## 2022-03-29 LAB — HIV ANTIBODY (ROUTINE TESTING W REFLEX): HIV Screen 4th Generation wRfx: NONREACTIVE

## 2022-03-30 ENCOUNTER — Other Ambulatory Visit: Payer: Self-pay

## 2022-03-30 DIAGNOSIS — R768 Other specified abnormal immunological findings in serum: Secondary | ICD-10-CM

## 2022-04-14 ENCOUNTER — Ambulatory Visit
Admission: RE | Admit: 2022-04-14 | Discharge: 2022-04-14 | Disposition: A | Discharge status: Home / Self Care | Payer: 59 | Source: Ambulatory Visit | Attending: Internal Medicine | Admitting: Internal Medicine

## 2022-04-14 DIAGNOSIS — R928 Other abnormal and inconclusive findings on diagnostic imaging of breast: Secondary | ICD-10-CM | POA: Diagnosis present

## 2022-04-14 DIAGNOSIS — N6489 Other specified disorders of breast: Secondary | ICD-10-CM | POA: Insufficient documentation

## 2022-12-24 ENCOUNTER — Ambulatory Visit
Admission: EM | Admit: 2022-12-24 | Discharge: 2022-12-24 | Disposition: A | Discharge status: Home / Self Care | Payer: Commercial Managed Care - PPO | Attending: Family Medicine | Admitting: Family Medicine

## 2022-12-24 ENCOUNTER — Ambulatory Visit (INDEPENDENT_AMBULATORY_CARE_PROVIDER_SITE_OTHER): Payer: Commercial Managed Care - PPO

## 2022-12-24 DIAGNOSIS — W19XXXA Unspecified fall, initial encounter: Secondary | ICD-10-CM | POA: Diagnosis not present

## 2022-12-24 DIAGNOSIS — M25571 Pain in right ankle and joints of right foot: Secondary | ICD-10-CM | POA: Diagnosis not present

## 2022-12-24 DIAGNOSIS — S93401A Sprain of unspecified ligament of right ankle, initial encounter: Secondary | ICD-10-CM

## 2022-12-24 DIAGNOSIS — M25561 Pain in right knee: Secondary | ICD-10-CM | POA: Diagnosis not present

## 2022-12-24 MED ORDER — NAPROXEN 500 MG PO TABS: 500.0000 mg | ORAL_TABLET | Freq: Two times a day (BID) | ORAL | 0 refills | Status: AC

## 2022-12-24 MED ORDER — METHOCARBAMOL 500 MG PO TABS: 500.0000 mg | ORAL_TABLET | Freq: Two times a day (BID) | ORAL | 0 refills | Status: DC

## 2022-12-24 NOTE — ED Provider Notes (Signed)
[facility]    CSN: [id number] Arrival date & time: 12/24/22  1346      History   Chief Complaint Chief Complaint  Patient presents with   Knee Injury   Ankle Injury    HPI  HPI Lauren Gilmore is a 42 y.o. female.   Lauren Gilmore presents for right ankle and ankle pain. She was distracted while coming steps last night  and missed the last step. She fell to the ground and twisted her ankle. She has some swelling in the knee and pain. Pain radiates from back of the knee to the front with a popping sensation. She has burning sensation in her skin. She took some ibuprofen and put some ice on it. No previous ankle or foot injuries on the right. Denies any other injuries.      Past Medical History:  Diagnosis Date   Gestational diabetes    Gestational hypertension     Patient Active Problem List   Diagnosis Date Noted   Mood disorder (HCC) 03/24/2020    Past Surgical History:  Procedure Laterality Date   CESAREAN SECTION  2017   TUBAL LIGATION     WISDOM TOOTH EXTRACTION  2002    OB History   No obstetric history on file.      Home Medications    Prior to Admission medications   Medication Sig Start Date End Date Taking? Authorizing Provider  methocarbamol (ROBAXIN) 500 MG tablet Take 1 tablet (500 mg total) by mouth 2 (two) times daily. 12/24/22  Yes Theressa Piedra, DO  naproxen (NAPROSYN) 500 MG tablet Take 1 tablet (500 mg total) by mouth 2 (two) times daily with a meal. 12/24/22  Yes Salinda Snedeker, DO  Lido-Menthol-Methyl Sal-Camph (CBD KINGS EX) Apply topically. 50 mg- anxiety    [provider]  Melatonin 10 MG CAPS Take by mouth.    [provider]    Family History Family History  Problem Relation Age of Onset   Hypertension Mother    Heart disease Father    Hypertension Father    Cancer Maternal Grandmother    Breast cancer Maternal Grandmother 92   Diabetes Paternal Grandmother    Hypertension Paternal  Grandmother    Stroke Paternal Grandmother    Heart disease Paternal Grandfather    Hypertension Paternal Grandfather     Social History Social History   Tobacco Use   Smoking status: Never   Smokeless tobacco: Never  Vaping Use   Vaping Use: Never used  Substance Use Topics   Alcohol use: Yes    Comment: 1-2 week   Drug use: Not Currently     Allergies   Amoxicillin   Review of Systems Review of Systems: :negative unless otherwise stated in HPI.      Physical Exam Triage Vital Signs ED Triage Vitals  Enc Vitals Group     BP 12/24/22 1413 (P) 126/74     Pulse Rate 12/24/22 1413 (P) 78     Resp 12/24/22 1413 (P) 16     Temp 12/24/22 1413 (P) 98.7 F (37.1 C)     Temp Source 12/24/22 1413 (P) Oral     SpO2 12/24/22 1413 (P) 99 %     Weight 12/24/22 1410 170 lb (77.1 kg)     Height 12/24/22 1410 5\' 6"  (1.676 m)     Head Circumference --      Peak Flow --      Pain Score 12/24/22 1410 8  Pain Loc --      Pain Edu? --      Excl. in GC? --    No data found.  Updated Vital Signs BP (P) 126/74 (BP Location: Right Arm)   Pulse (P) 78   Temp (P) 98.7 F (37.1 C) (Oral)   Resp (P) 16   Ht 5\' 6"  (1.676 m)   Wt 77.1 kg   LMP 12/07/2022   SpO2 (P) 99%   BMI 27.44 kg/m   Visual Acuity Right Eye Distance:   Left Eye Distance:   Bilateral Distance:    Right Eye Near:   Left Eye Near:    Bilateral Near:     Physical Exam GEN: well appearing female in no acute distress  CVS: well perfused  RESP: speaking in full sentences without pause, no respiratory distress  MSK:  Right ankle: Inspection: No erythema, edema, ecchymosis or bony deformity, no bone pes planus or cavus deformity, transverse and medial arches intact Palpation: lateral malleoli tenderness ROM: Full active and passive range of motion Strength: 5/5 in all directions No ligamentous laxity No pain at the base of the fifth metatarsal Able to ambulate  without pain  Special Tests: anterior  drawer: Negative, posterior drawer: Negative, negative calcaneal squeeze  Right Knee Exam -Inspection: no deformity, no discoloration -Palpation: Patellar tenderness, no malleoli or tenderness -ROM: Extension: 0 degrees; Flexion: 140 degrees -Strength 5/5 hip, knee -Special Tests: Varus Stress: Negative; Valgus Stress: Negative; anterior drawer: Negative; Posterior drawer: Negative; McMurray: Negative; Thessaly: Negative; Patellar grind: Negative -Limb neurovascularly intact, no instability noted    UC Treatments / Results  Labs (all labs ordered are listed, but only abnormal results are displayed) Labs Reviewed - No data to display  EKG   Radiology DG Ankle Complete Right  Result Date: 12/24/2022 CLINICAL DATA:  Pain after twisting injury EXAM: RIGHT ANKLE - COMPLETE 3 VIEW COMPARISON:  None Available. FINDINGS: There is no evidence of fracture, dislocation, or joint effusion. There is no evidence of arthropathy or other focal bone abnormality. Soft tissues are unremarkable. IMPRESSION: Negative. Electronically Signed   By: Jacob Moores M.D.   On: 12/24/2022 14:40   DG Knee Complete 4 Views Right  Result Date: 12/24/2022 CLINICAL DATA:  Pain after twisting injury EXAM: RIGHT KNEE - COMPLETE 4 VIEW COMPARISON:  None Available. FINDINGS: No evidence of fracture, dislocation, or joint effusion. No evidence of arthropathy or other focal bone abnormality. Soft tissues are unremarkable. IMPRESSION: Negative. Electronically Signed   By: Jacob Moores M.D.   On: 12/24/2022 14:39     Procedures Procedures (including critical care time)  Medications Ordered in UC Medications - No data to display  Initial Impression / Assessment and Plan / UC Course  I have reviewed the triage vital signs and the nursing notes.  Pertinent labs & imaging results that were available during my care of the patient were reviewed by me and considered in my medical decision making (see chart for  details).      Pt is a 42 y.o.  female with 1 day of right knee and ankle pain after falling down steps. On exam, pt has patellar tenderness of knee and lateral malleoli tenderness of the ankle.  Additionally, patella J tracks.  Obtained right knee and ankle plain films.  Which were personally interpreted by me and unremarkable for fracture or dislocation. Radiologist report reviewed and additionally notes  no soft tissue swelling.  Given knee brace prior to discharge.  Patient declined crutches  and ankle brace.  Suspect patellar subluxation.  Patient to gradually return to normal activities, as tolerated and continue ordinary activities within the limits permitted by pain. Prescribed Naproxen sodium  and muscle relaxer  for pain relief.  Tylenol PRN. Advised patient to avoid OTC NSAIDs while taking prescription NSAID. Counseled patient on red flag symptoms and when to seek immediate care.    Patient to follow up with orthopedic provider, if symptoms do not improve with conservative treatment.  Return and ED precautions given. Understanding voiced. Discussed MDM, treatment plan and plan for follow-up with patient who agrees with plan.   Final Clinical Impressions(s) / UC Diagnoses   Final diagnoses:  Sprain of right ankle, unspecified ligament, initial encounter  Acute pain of right knee  Fall, initial encounter     Discharge Instructions      If medication was prescribed, stop by the pharmacy to pick up your prescriptions.  For your  pain, Take 1500 mg Tylenol twice a day, take muscle relaxer (Robaxin) twice a day, take Naprosyn twice a day,  as needed for pain. Wear your knee brace. Rest and elevate the affected painful area.  Apply cold compresses intermittently, as needed.  As pain recedes, begin normal activities slowly as tolerated.  Follow up with primary care provider or an orthopedic provider, if symptoms persist.  Watch for worsening symptoms such as an increasing weakness or loss  of sensation, increasing pain and/or the loss of bladder or bowel function. Should any of these occur, go to the emergency department immediately.        ED Prescriptions     Medication Sig Dispense Auth. Provider   naproxen (NAPROSYN) 500 MG tablet Take 1 tablet (500 mg total) by mouth 2 (two) times daily with a meal. 30 tablet Miraya Cudney, DO   methocarbamol (ROBAXIN) 500 MG tablet Take 1 tablet (500 mg total) by mouth 2 (two) times daily. 20 tablet Katha Cabal, DO      PDMP not reviewed this encounter.   Katha Cabal, DO 12/27/22 [phone]

## 2022-12-24 NOTE — Discharge Instructions (Addendum)
If medication was prescribed, stop by the pharmacy to pick up your prescriptions.  For your  pain, Take 1500 mg Tylenol twice a day, take muscle relaxer (Robaxin) twice a day, take Naprosyn twice a day,  as needed for pain. Wear your knee brace. Rest and elevate the affected painful area.  Apply cold compresses intermittently, as needed.  As pain recedes, begin normal activities slowly as tolerated.  Follow up with primary care provider or an orthopedic provider, if symptoms persist.  Watch for worsening symptoms such as an increasing weakness or loss of sensation, increasing pain and/or the loss of bladder or bowel function. Should any of these occur, go to the emergency department immediately.

## 2022-12-24 NOTE — ED Triage Notes (Signed)
Pt was coming down outside steps last evening and lost her footing. Twisted her right leg and fell into grass. Burning pain starting behind knee and comes around to front. Right ankle swollen laterally and difficulty bearing weight.

## 2023-03-16 ENCOUNTER — Other Ambulatory Visit: Payer: Self-pay | Admitting: Internal Medicine

## 2023-03-16 DIAGNOSIS — Z1231 Encounter for screening mammogram for malignant neoplasm of breast: Secondary | ICD-10-CM

## 2023-03-31 ENCOUNTER — Encounter: Payer: 59 | Admitting: Internal Medicine

## 2023-04-07 ENCOUNTER — Ambulatory Visit (INDEPENDENT_AMBULATORY_CARE_PROVIDER_SITE_OTHER): Payer: Commercial Managed Care - PPO | Admitting: Internal Medicine

## 2023-04-07 ENCOUNTER — Encounter: Payer: Self-pay | Admitting: Internal Medicine

## 2023-04-07 VITALS — BP 122/74 | HR 49 | Ht 66.0 in | Wt 192.0 lb

## 2023-04-07 DIAGNOSIS — Z13220 Encounter for screening for lipoid disorders: Secondary | ICD-10-CM | POA: Diagnosis not present

## 2023-04-07 DIAGNOSIS — R7303 Prediabetes: Secondary | ICD-10-CM | POA: Diagnosis not present

## 2023-04-07 DIAGNOSIS — Z0000 Encounter for general adult medical examination without abnormal findings: Secondary | ICD-10-CM

## 2023-04-07 DIAGNOSIS — Z1231 Encounter for screening mammogram for malignant neoplasm of breast: Secondary | ICD-10-CM

## 2023-04-07 DIAGNOSIS — R768 Other specified abnormal immunological findings in serum: Secondary | ICD-10-CM | POA: Diagnosis not present

## 2023-04-07 NOTE — Assessment & Plan Note (Signed)
With ongoing stiffness in both hands Will refer to Rheumatology

## 2023-04-07 NOTE — Progress Notes (Signed)
Date:  04/07/2023   Name:  Lauren Gilmore   DOB:  Sep 26, 1980   MRN:  [id number]   Chief Complaint: Annual Exam Lauren Gilmore is a 42 y.o. female who presents today for her Complete Annual Exam. She feels well. She reports exercising -walking, yard work. She reports she is sleeping fairly well. Breast complaints none. Having some irregular menses - longer interval and some heavier bleeding or shorter menses.  Mild hot flashes but not severe.  Mammogram: scheduled 06/16/23 DEXA: none Colonoscopy: none Pap: 02/2020 neg/neg  There are no preventive care reminders to display for this patient.   Immunization History  Administered Date(s) Administered   Influenza, Seasonal, Injecte, Preservative Fre 03/28/2023   Influenza,inj,Quad PF,6+ Mos 03/24/2020, 03/25/2021, 03/28/2022   MMR 09/07/1979   PFIZER(Purple Top)SARS-COV-2 Vaccination 10/04/2019, 10/25/2019, 07/28/2020   PPD Test 10/07/2019   Tdap 09/07/2012, 12/21/2015     Hand Pain  There was no injury mechanism. The quality of the pain is described as aching (and stiffness). The pain is mild. The pain has been Fluctuating since the incident. Pertinent negatives include no chest pain. She has tried acetaminophen and NSAIDs (+ RF last year but missed referral appt) for the symptoms.    Review of Systems  Constitutional:  Negative for chills, fatigue and fever.  HENT:  Negative for congestion, hearing loss, tinnitus, trouble swallowing and voice change.   Eyes:  Negative for visual disturbance.  Respiratory:  Negative for cough, chest tightness, shortness of breath and wheezing.   Cardiovascular:  Negative for chest pain, palpitations and leg swelling.  Gastrointestinal:  Negative for abdominal pain, constipation, diarrhea and vomiting.  Endocrine: Negative for polydipsia and polyuria.  Genitourinary:  Negative for dysuria, frequency, genital sores, vaginal bleeding and vaginal discharge.  Musculoskeletal:  Positive for  arthralgias. Negative for gait problem, joint swelling and myalgias.  Skin:  Negative for color change and rash.  Neurological:  Negative for dizziness, tremors, light-headedness and headaches.  Hematological:  Negative for adenopathy. Does not bruise/bleed easily.  Psychiatric/Behavioral:  Negative for dysphoric mood and sleep disturbance. The patient is not nervous/anxious.      Lab Results  Component Value Date   NA 140 03/28/2022   K 4.1 03/28/2022   CO2 22 03/28/2022   GLUCOSE 102 (H) 03/28/2022   BUN 11 03/28/2022   CREATININE 0.94 03/28/2022   CALCIUM 9.4 03/28/2022   EGFR 77 03/28/2022   GFRNONAA 76 03/24/2020   Lab Results  Component Value Date   CHOL 162 03/28/2022   HDL 45 03/28/2022   LDLCALC 99 03/28/2022   TRIG 96 03/28/2022   CHOLHDL 3.6 03/28/2022   Lab Results  Component Value Date   TSH 1.530 03/28/2022   Lab Results  Component Value Date   HGBA1C 5.8 (H) 03/28/2022   Lab Results  Component Value Date   WBC 5.1 03/28/2022   HGB 14.1 03/28/2022   HCT 42.3 03/28/2022   MCV 91 03/28/2022   PLT 193 03/28/2022   Lab Results  Component Value Date   ALT 14 03/28/2022   AST 17 03/28/2022   ALKPHOS 83 03/28/2022   BILITOT 0.4 03/28/2022   No results found for: "25OHVITD2", "25OHVITD3", "VD25OH"   Patient Active Problem List   Diagnosis Date Noted   Rheumatoid factor positive 04/07/2023    Allergies  Allergen Reactions   Amoxicillin Hives and Rash    Past Surgical History:  Procedure Laterality Date   CESAREAN SECTION  2017   TUBAL LIGATION  WISDOM TOOTH EXTRACTION  2002    Social History   Tobacco Use   Smoking status: Never   Smokeless tobacco: Never  Vaping Use   Vaping status: Never Used  Substance Use Topics   Alcohol use: Yes    Comment: 1-2 week   Drug use: Not Currently     Medication list has been reviewed and updated.  Current Meds  Medication Sig   Lido-Menthol-Methyl Sal-Camph (CBD KINGS EX) Apply topically.  50 mg- anxiety   Melatonin 10 MG CAPS Take by mouth.   naproxen (NAPROSYN) 500 MG tablet Take 1 tablet (500 mg total) by mouth 2 (two) times daily with a meal.       04/07/2023    9:00 AM 03/28/2022    9:16 AM 03/25/2021    8:45 AM 03/24/2020    8:05 AM  GAD 7 : Generalized Anxiety Score  Nervous, Anxious, on Edge 0 0 1 3  Control/stop worrying 0 0 1 3  Worry too much - different things 0 0 1 3  Trouble relaxing 0 0 1 3  Restless 0 0 1 3  Easily annoyed or irritable 0 0 1 3  Afraid - awful might happen 0 0 1 1  Total GAD 7 Score 0 0 7 19  Anxiety Difficulty Not difficult at all Not difficult at all Somewhat difficult Extremely difficult       04/07/2023    9:00 AM 03/28/2022    9:16 AM 03/25/2021    8:44 AM  Depression screen PHQ 2/9  Decreased Interest 0 0 1  Down, Depressed, Hopeless 0 0 1  PHQ - 2 Score 0 0 2  Altered sleeping 0 0 2  Tired, decreased energy 0 0 1  Change in appetite 0 0 2  Feeling bad or failure about yourself  0 0 1  Trouble concentrating 0 0 0  Moving slowly or fidgety/restless 0 0 0  Suicidal thoughts 0 0 0  PHQ-9 Score 0 0 8  Difficult doing work/chores Not difficult at all Not difficult at all Somewhat difficult    BP Readings from Last 3 Encounters:  04/07/23 122/74  12/24/22 (P) 126/74  03/28/22 128/72    Physical Exam Vitals and nursing note reviewed.  Constitutional:      General: She is not in acute distress.    Appearance: She is well-developed.  HENT:     Head: Normocephalic and atraumatic.     Right Ear: Tympanic membrane and ear canal normal.     Left Ear: Tympanic membrane and ear canal normal.     Nose:     Right Sinus: No maxillary sinus tenderness.     Left Sinus: No maxillary sinus tenderness.  Eyes:     General: No scleral icterus.       Right eye: No discharge.        Left eye: No discharge.     Conjunctiva/sclera: Conjunctivae normal.  Neck:     Thyroid: No thyromegaly.     Vascular: No carotid bruit.   Cardiovascular:     Rate and Rhythm: Normal rate and regular rhythm.     Pulses: Normal pulses.     Heart sounds: Normal heart sounds.  Pulmonary:     Effort: Pulmonary effort is normal. No respiratory distress.     Breath sounds: No wheezing.  Chest:  Breasts:    Right: No mass, nipple discharge, skin change or tenderness.     Left: No mass, nipple discharge, skin change  or tenderness.  Abdominal:     General: Bowel sounds are normal.     Palpations: Abdomen is soft.     Tenderness: There is no abdominal tenderness.  Musculoskeletal:     Right hand: No swelling or deformity.     Left hand: No swelling or deformity.     Cervical back: Normal range of motion. No erythema.     Right lower leg: No edema.     Left lower leg: No edema.  Lymphadenopathy:     Cervical: No cervical adenopathy.  Skin:    General: Skin is warm and dry.     Findings: No rash.  Neurological:     Mental Status: She is alert and oriented to person, place, and time.     Cranial Nerves: No cranial nerve deficit.     Sensory: No sensory deficit.     Deep Tendon Reflexes: Reflexes are normal and symmetric.  Psychiatric:        Attention and Perception: Attention normal.        Mood and Affect: Mood normal.     Wt Readings from Last 3 Encounters:  04/07/23 192 lb (87.1 kg)  12/24/22 170 lb (77.1 kg)  03/28/22 193 lb (87.5 kg)    BP 122/74   Pulse (!) 49   Ht 5\' 6"  (1.676 m)   Wt 192 lb (87.1 kg)   SpO2 98%   BMI 30.99 kg/m   Assessment and Plan:  Problem List Items Addressed This Visit       Unprioritized   Rheumatoid factor positive    With ongoing stiffness in both hands Will refer to Rheumatology      Relevant Orders   Ambulatory referral to Rheumatology   Other Visit Diagnoses     Annual physical exam    -  Primary   Relevant Orders   CBC with Differential/Platelet   Comprehensive metabolic panel   Hemoglobin A1c   Lipid panel   TSH   Encounter for screening mammogram for  breast cancer       scheduled for December   Prediabetes       Relevant Orders   Hemoglobin A1c   Screening for lipid disorders       Relevant Orders   Lipid panel       No follow-ups on file.    Reubin Milan, MD [facility] [place]

## 2023-04-08 LAB — COMPREHENSIVE METABOLIC PANEL
ALT: 17 [IU]/L (ref 0–32)
AST: 17 [IU]/L (ref 0–40)
Albumin: 4.6 g/dL (ref 3.9–4.9)
Alkaline Phosphatase: 79 [IU]/L (ref 44–121)
BUN/Creatinine Ratio: 10 (ref 9–23)
BUN: 9 mg/dL (ref 6–24)
Bilirubin Total: 0.4 mg/dL (ref 0.0–1.2)
CO2: 21 mmol/L (ref 20–29)
Calcium: 9.5 mg/dL (ref 8.7–10.2)
Chloride: 102 mmol/L (ref 96–106)
Creatinine, Ser: 0.91 mg/dL (ref 0.57–1.00)
Globulin, Total: 2.7 g/dL (ref 1.5–4.5)
Glucose: 95 mg/dL (ref 70–99)
Potassium: 4.3 mmol/L (ref 3.5–5.2)
Sodium: 137 mmol/L (ref 134–144)
Total Protein: 7.3 g/dL (ref 6.0–8.5)
eGFR: 80 mL/min/{1.73_m2} (ref >59)

## 2023-04-08 LAB — TSH: TSH: 1.54 u[IU]/mL (ref 0.450–4.500)

## 2023-04-08 LAB — CBC WITH DIFFERENTIAL/PLATELET
Basophils Absolute: 0.1 10*3/uL (ref 0.0–0.2)
Basos: 1 %
EOS (ABSOLUTE): 0.1 10*3/uL (ref 0.0–0.4)
Eos: 2 %
Hematocrit: 43.5 % (ref 34.0–46.6)
Hemoglobin: 13.8 g/dL (ref 11.1–15.9)
Immature Grans (Abs): 0 10*3/uL (ref 0.0–0.1)
Immature Granulocytes: 0 %
Lymphocytes Absolute: 1.7 10*3/uL (ref 0.7–3.1)
Lymphs: 26 %
MCH: 30 pg (ref 26.6–33.0)
MCHC: 31.7 g/dL (ref 31.5–35.7)
MCV: 95 fL (ref 79–97)
Monocytes Absolute: 0.5 10*3/uL (ref 0.1–0.9)
Monocytes: 7 %
Neutrophils Absolute: 4.1 10*3/uL (ref 1.4–7.0)
Neutrophils: 64 %
Platelets: 200 10*3/uL (ref 150–450)
RBC: 4.6 x10E6/uL (ref 3.77–5.28)
RDW: 13 % (ref 11.7–15.4)
WBC: 6.4 10*3/uL (ref 3.4–10.8)

## 2023-04-08 LAB — LIPID PANEL
Chol/HDL Ratio: 3.2 {ratio} (ref 0.0–4.4)
Cholesterol, Total: 178 mg/dL (ref 100–199)
HDL: 55 mg/dL (ref >39)
LDL Chol Calc (NIH): 103 mg/dL — ABNORMAL HIGH (ref 0–99)
Triglycerides: 111 mg/dL (ref 0–149)
VLDL Cholesterol Cal: 20 mg/dL (ref 5–40)

## 2023-04-08 LAB — HEMOGLOBIN A1C
Est. average glucose Bld gHb Est-mCnc: 117 mg/dL
Hgb A1c MFr Bld: 5.7 % — ABNORMAL HIGH (ref 4.8–5.6)

## 2023-04-26 ENCOUNTER — Ambulatory Visit: Payer: Commercial Managed Care - PPO

## 2023-08-21 IMAGING — MG MM DIGITAL SCREENING BILAT W/ TOMO AND CAD
8 series · 8 of 24 positions shown · non-contrast
Comparison: Previous exam(s).

CLINICAL DATA: Screening.

EXAM:
DIGITAL SCREENING BILATERAL MAMMOGRAM WITH TOMOSYNTHESIS AND CAD
TECHNIQUE: Bilateral screening digital craniocaudal and mediolateral oblique
mammograms were obtained. Bilateral screening digital breast
tomosynthesis was performed. The images were evaluated with
computer-aided detection.

[R CC synth-2D]
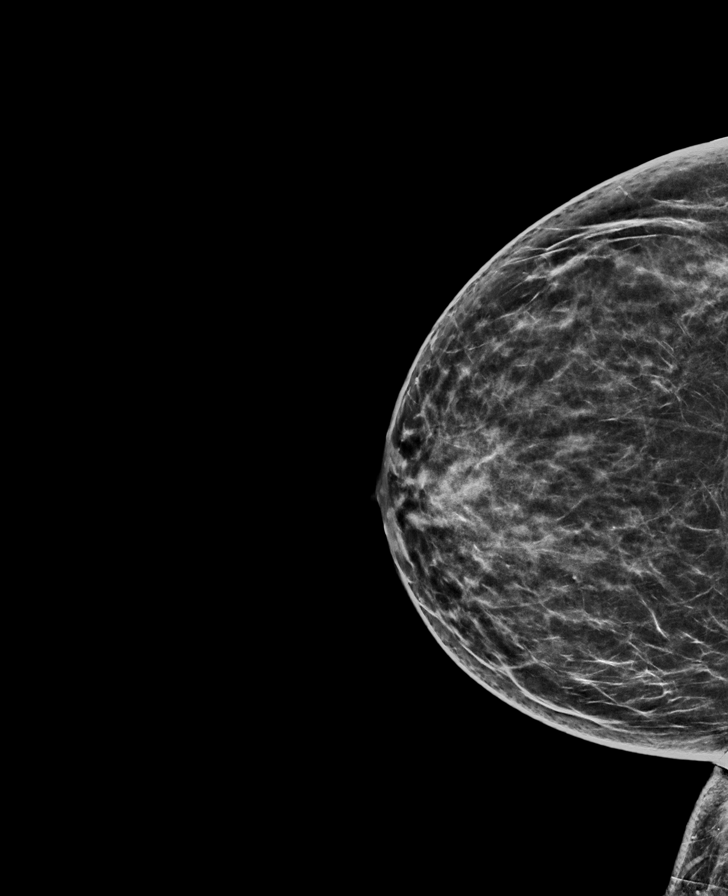

[L MLO synth-2D]
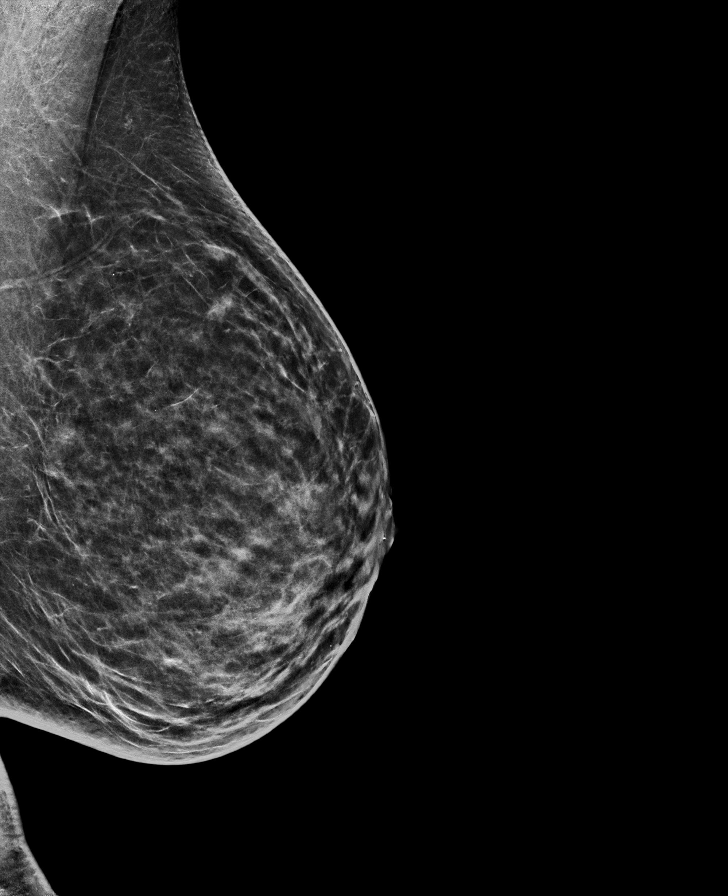

[R MLO synth-2D]
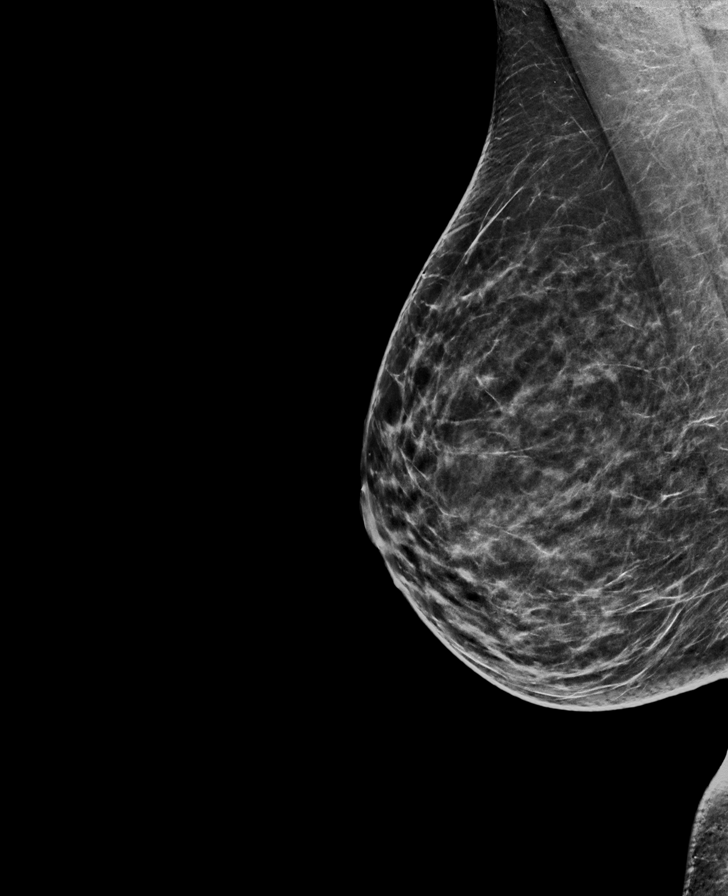

[L CC synth-2D]
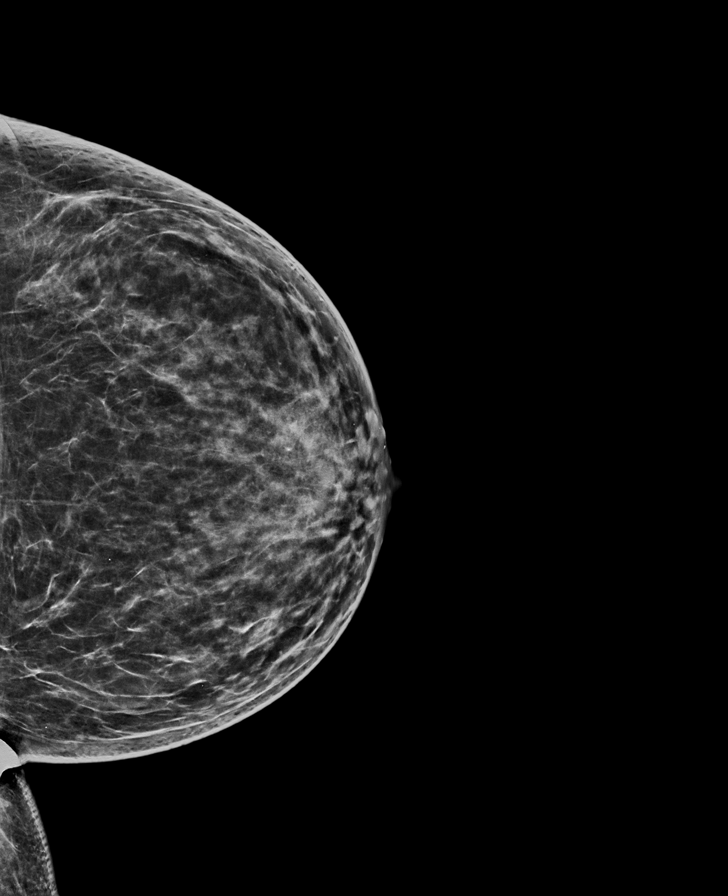

[R MLO tomo · tomo slice 35/69.0]
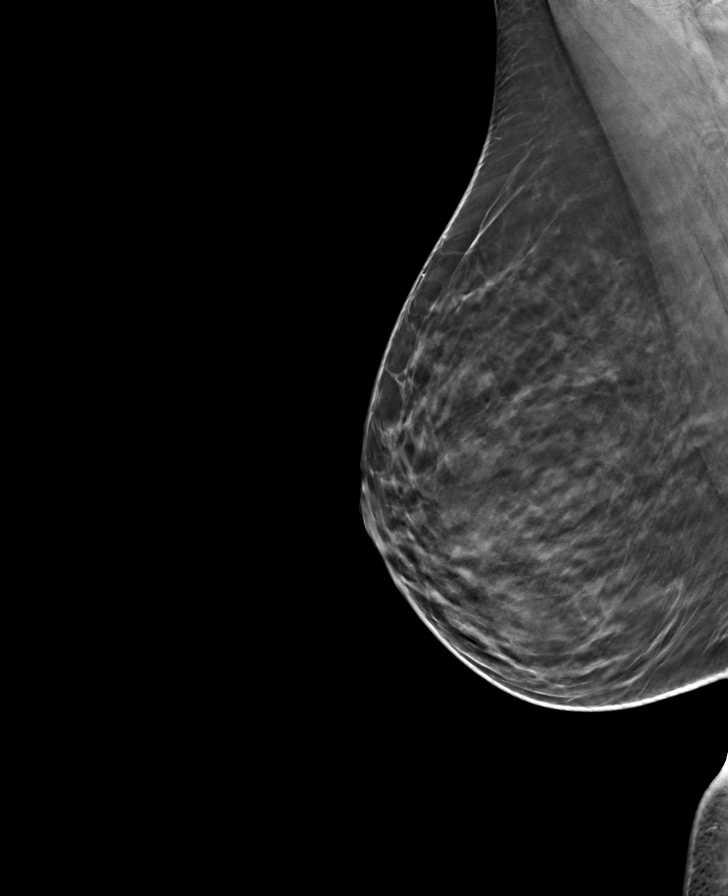

[L MLO tomo · tomo slice 35/69.0]
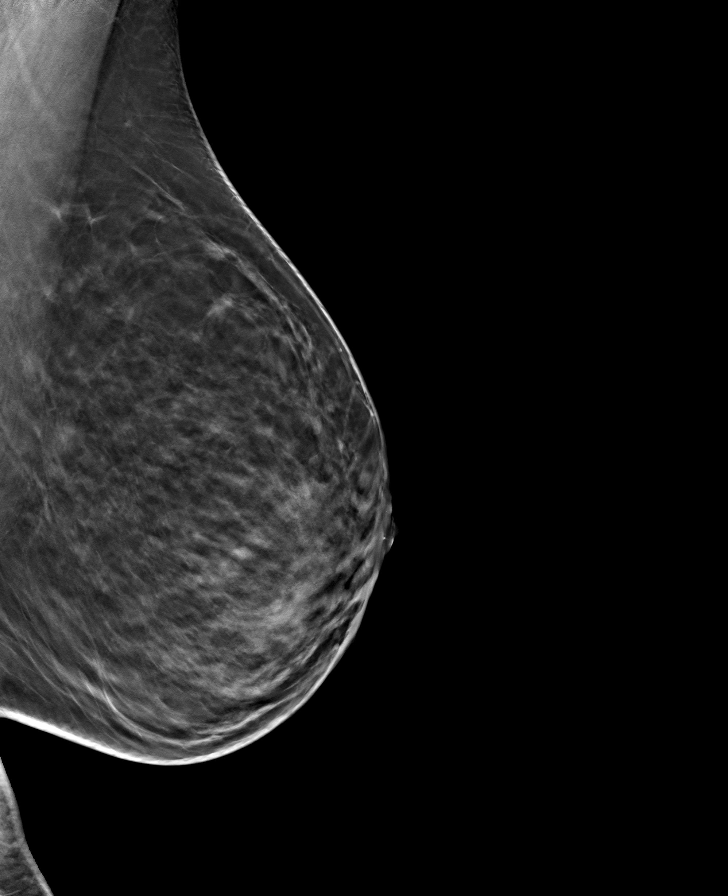

[L CC tomo · tomo slice 35/70.0]
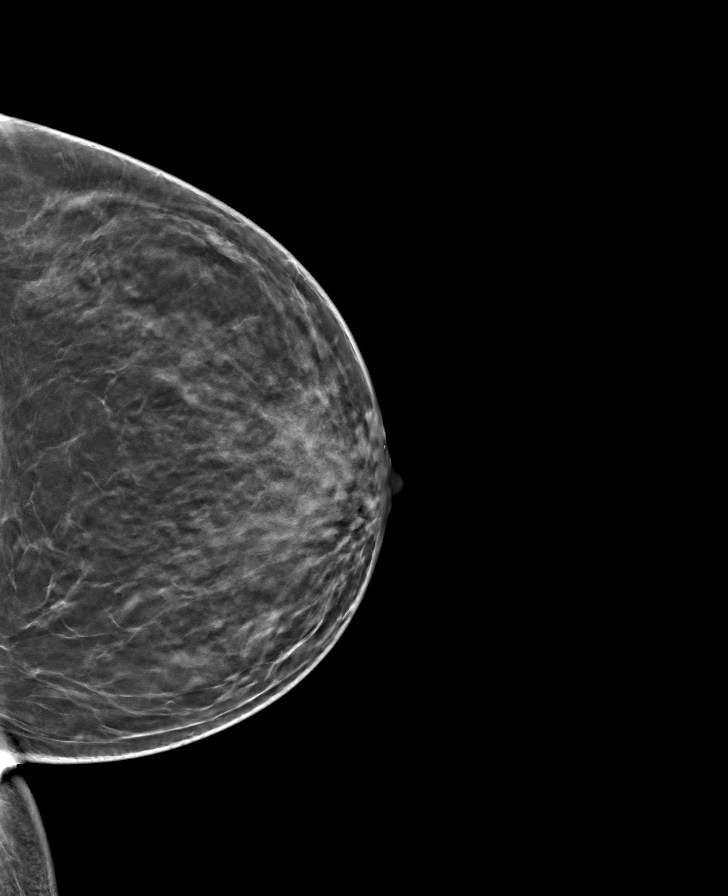

[R CC tomo · tomo slice 35/70.0]
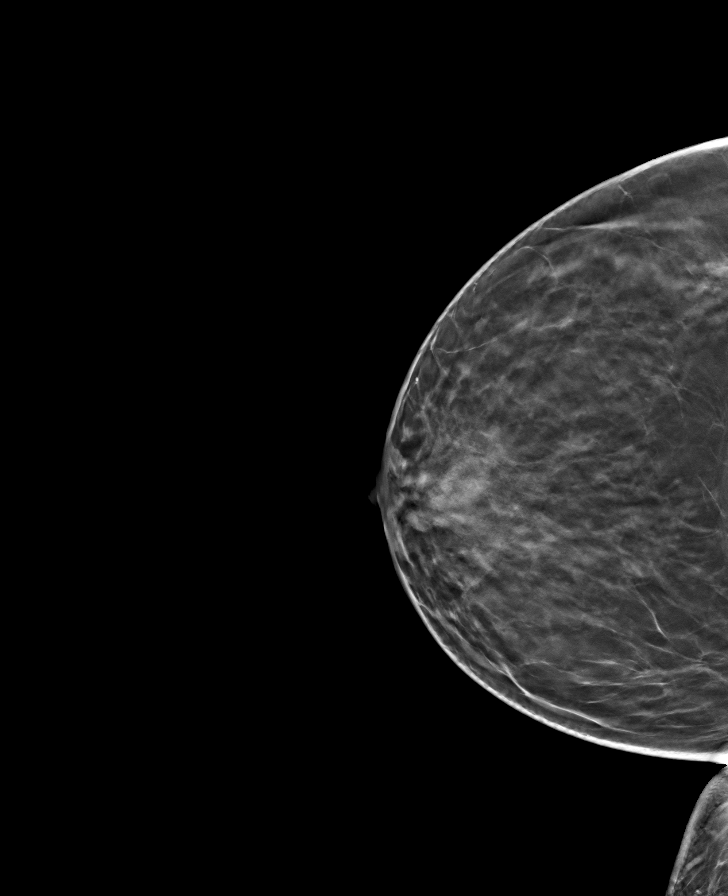

[8 of 24 positions shown; findings below may reference images not displayed]

ACR Breast Density Category c: The breast tissue is heterogeneously
dense, which may obscure small masses.
FINDINGS: There are no findings suspicious for malignancy.
IMPRESSION: No mammographic evidence of malignancy. A result letter of this
screening mammogram will be mailed directly to the patient.

RECOMMENDATION:
Screening mammogram in one year. (Code:Q3-W-BC3)

BI-RADS CATEGORY  1: Negative.

## 2024-01-19 ENCOUNTER — Ambulatory Visit: Admitting: Orthopedic Surgery

## 2024-04-12 ENCOUNTER — Encounter: Payer: Self-pay | Admitting: Internal Medicine
